# Patient Record
Sex: Male | Born: 1975 | Race: Black or African American | Hispanic: No | Marital: Single | Smoking: Current every day smoker
Health system: Southern US, Community
[De-identification: ages and names within clinical notes are randomized; demographics above are authoritative.]

## PROBLEM LIST (undated history)

## (undated) DIAGNOSIS — I10 Essential (primary) hypertension: Secondary | ICD-10-CM

## (undated) HISTORY — DX: Essential (primary) hypertension: I10

---

## 2004-08-08 HISTORY — PX: HERNIA REPAIR: SHX51

## 2018-09-26 ENCOUNTER — Other Ambulatory Visit: Payer: Self-pay

## 2018-09-26 ENCOUNTER — Encounter: Payer: Self-pay | Admitting: Emergency Medicine

## 2018-09-26 DIAGNOSIS — Z87891 Personal history of nicotine dependence: Secondary | ICD-10-CM | POA: Diagnosis not present

## 2018-09-26 DIAGNOSIS — K529 Noninfective gastroenteritis and colitis, unspecified: Secondary | ICD-10-CM | POA: Diagnosis not present

## 2018-09-26 DIAGNOSIS — R1033 Periumbilical pain: Secondary | ICD-10-CM | POA: Diagnosis present

## 2018-09-26 LAB — COMPREHENSIVE METABOLIC PANEL
ALT: 24 U/L (ref 0–44)
AST: 26 U/L (ref 15–41)
Albumin: 4.3 g/dL (ref 3.5–5.0)
Alkaline Phosphatase: 43 U/L (ref 38–126)
Anion gap: 7 (ref 5–15)
BUN: 16 mg/dL (ref 6–20)
CO2: 26 mmol/L (ref 22–32)
Calcium: 9.2 mg/dL (ref 8.9–10.3)
Chloride: 104 mmol/L (ref 98–111)
Creatinine, Ser: 0.94 mg/dL (ref 0.61–1.24)
Glucose, Bld: 112 mg/dL — ABNORMAL HIGH (ref 70–99)
Potassium: 3.8 mmol/L (ref 3.5–5.1)
Sodium: 137 mmol/L (ref 135–145)
Total Bilirubin: 0.6 mg/dL (ref 0.3–1.2)
Total Protein: 7.7 g/dL (ref 6.5–8.1)

## 2018-09-26 LAB — CBC WITH DIFFERENTIAL/PLATELET
Abs Immature Granulocytes: 0.03 10*3/uL (ref 0.00–0.07)
BASOS ABS: 0 10*3/uL (ref 0.0–0.1)
Basophils Relative: 0 %
Eosinophils Absolute: 0.1 10*3/uL (ref 0.0–0.5)
Eosinophils Relative: 1 %
HCT: 46.3 % (ref 39.0–52.0)
Hemoglobin: 15.2 g/dL (ref 13.0–17.0)
Immature Granulocytes: 0 %
Lymphocytes Relative: 28 %
Lymphs Abs: 2.1 10*3/uL (ref 0.7–4.0)
MCH: 28.3 pg (ref 26.0–34.0)
MCHC: 32.8 g/dL (ref 30.0–36.0)
MCV: 86.1 fL (ref 80.0–100.0)
Monocytes Absolute: 1.1 10*3/uL — ABNORMAL HIGH (ref 0.1–1.0)
Monocytes Relative: 14 %
Neutro Abs: 4.3 10*3/uL (ref 1.7–7.7)
Neutrophils Relative %: 57 %
PLATELETS: 243 10*3/uL (ref 150–400)
RBC: 5.38 MIL/uL (ref 4.22–5.81)
RDW: 12.8 % (ref 11.5–15.5)
WBC: 7.7 10*3/uL (ref 4.0–10.5)
nRBC: 0 % (ref 0.0–0.2)

## 2018-09-26 LAB — LIPASE, BLOOD: Lipase: 36 U/L (ref 11–51)

## 2018-09-26 LAB — TROPONIN I: Troponin I: <0.03 ng/mL (ref <0.03)

## 2018-09-26 MED ORDER — SODIUM CHLORIDE 0.9% FLUSH: 3.0000 mL | Freq: Once | INTRAVENOUS | Status: DC

## 2018-09-26 NOTE — ED Triage Notes (Signed)
Patient states that he is having abd pain around his navel and it is intermittent but is sharp. Patient has been having diarrhea about 5x.

## 2018-09-27 ENCOUNTER — Emergency Department: Payer: BLUE CROSS/BLUE SHIELD

## 2018-09-27 ENCOUNTER — Telehealth: Payer: Self-pay | Admitting: Gastroenterology

## 2018-09-27 ENCOUNTER — Emergency Department
Admission: EM | Admit: 2018-09-27 | Discharge: 2018-09-27 | Disposition: A | Discharge status: Home / Self Care | Payer: BLUE CROSS/BLUE SHIELD | Attending: Emergency Medicine | Admitting: Emergency Medicine

## 2018-09-27 DIAGNOSIS — R1033 Periumbilical pain: Secondary | ICD-10-CM

## 2018-09-27 DIAGNOSIS — R197 Diarrhea, unspecified: Secondary | ICD-10-CM

## 2018-09-27 DIAGNOSIS — K529 Noninfective gastroenteritis and colitis, unspecified: Secondary | ICD-10-CM

## 2018-09-27 LAB — URINALYSIS, COMPLETE (UACMP) WITH MICROSCOPIC
Bacteria, UA: NONE SEEN
Bilirubin Urine: NEGATIVE
Glucose, UA: NEGATIVE mg/dL
Hgb urine dipstick: NEGATIVE
Ketones, ur: NEGATIVE mg/dL
Leukocytes,Ua: NEGATIVE
Nitrite: NEGATIVE
Protein, ur: NEGATIVE mg/dL
SQUAMOUS EPITHELIAL / LPF: NONE SEEN (ref 0–5)
Specific Gravity, Urine: 1.028 (ref 1.005–1.030)
pH: 5 (ref 5.0–8.0)

## 2018-09-27 MED ORDER — AMOXICILLIN-POT CLAVULANATE 875-125 MG PO TABS
1.0000 | ORAL_TABLET | Freq: Once | ORAL | Status: AC
  Administered 2018-09-27: 1 via ORAL
  Filled 2018-09-27: qty 1

## 2018-09-27 MED ORDER — IOHEXOL 300 MG/ML  SOLN
125.0000 mL | Freq: Once | INTRAMUSCULAR | Status: AC | PRN
  Administered 2018-09-27: 125 mL via INTRAVENOUS

## 2018-09-27 MED ORDER — METRONIDAZOLE 500 MG PO TABS: 500.0000 mg | ORAL_TABLET | Freq: Three times a day (TID) | ORAL | 0 refills | Status: AC

## 2018-09-27 MED ORDER — DOCUSATE SODIUM 100 MG PO CAPS: ORAL_CAPSULE | ORAL | 0 refills | Status: DC

## 2018-09-27 MED ORDER — MORPHINE SULFATE (PF) 4 MG/ML IV SOLN
4.0000 mg | Freq: Once | INTRAVENOUS | Status: AC
  Administered 2018-09-27: 4 mg via INTRAVENOUS
  Filled 2018-09-27: qty 1

## 2018-09-27 MED ORDER — ONDANSETRON HCL 4 MG/2ML IJ SOLN
4.0000 mg | INTRAMUSCULAR | Status: AC
  Administered 2018-09-27: 4 mg via INTRAVENOUS
  Filled 2018-09-27: qty 2

## 2018-09-27 MED ORDER — METRONIDAZOLE 500 MG PO TABS
500.0000 mg | ORAL_TABLET | Freq: Once | ORAL | Status: AC
  Administered 2018-09-27: 500 mg via ORAL
  Filled 2018-09-27: qty 1

## 2018-09-27 MED ORDER — OXYCODONE-ACETAMINOPHEN 5-325 MG PO TABS: 2.0000 | ORAL_TABLET | Freq: Four times a day (QID) | ORAL | 0 refills | Status: DC | PRN

## 2018-09-27 MED ORDER — ONDANSETRON 4 MG PO TBDP: ORAL_TABLET | ORAL | 0 refills | Status: DC

## 2018-09-27 MED ORDER — AMOXICILLIN-POT CLAVULANATE 875-125 MG PO TABS: 1.0000 | ORAL_TABLET | Freq: Three times a day (TID) | ORAL | 0 refills | Status: AC

## 2018-09-27 NOTE — Telephone Encounter (Signed)
I received note in my in basket to schedule patient in the next wk or wk 1/2. The patient was offered 10-08-18 @ 1:00 but declined!! Stated he would call back to scheduled.

## 2018-09-27 NOTE — Discharge Instructions (Addendum)
As we discussed, I think that your symptoms are most likely due to an infection in your small intestine which is resulting in a condition called enteritis.  I have prescribed 2 antibiotics and encourage you to take the full course of treatment.  However, it is important that you follow-up with a GI doctor such as Dr. Tobi Bastos to discuss whether or not you need additional work-up to make sure you do not have a condition called Crohn's disease.  Please call the office of Dr. Tobi Bastos and schedule the next available follow-up appointment; let them know that the emergency department physician ask you to call and schedule the appointment for ED follow-up.  Please use over-the-counter ibuprofen and Tylenol as needed according to label instructions for pain.Take Percocet as prescribed for severe pain. Do not drink alcohol, drive or participate in any other potentially dangerous activities while taking this medication as it may make you sleepy. Do not take this medication with any other sedating medications, either prescription or over-the-counter. If you were prescribed Percocet or Vicodin, do not take these with acetaminophen (Tylenol) as it is already contained within these medications.   This medication is an opiate (or narcotic) pain medication and can be habit forming.  Use it as little as possible to achieve adequate pain control.  Do not use or use it with extreme caution if you have a history of opiate abuse or dependence.  If you are on a pain contract with your primary care doctor or a pain specialist, be sure to let them know you were prescribed this medication today from the [facility] Emergency Department.  This medication is intended for your use only - do not give any to anyone else and keep it in a secure place where nobody else, especially children, have access to it.  It will also cause or worsen constipation, so you may want to consider taking an over-the-counter stool softener while you are taking  this medication.  Return to the emergency department if you develop new or worsening symptoms that concern you.

## 2018-09-27 NOTE — ED Provider Notes (Signed)
[facility] Emergency Department Provider Note  ____________________________________________   First MD Initiated Contact with Patient 09/27/18 [phone]     (approximate)  I have reviewed the triage vital signs and the nursing notes.   HISTORY  Chief Complaint Abdominal Pain    HPI Bradley SchullerKevin Davidson is a 43 y.o. male with history of prior abdominal hernia repair about 15 years ago who presents by private vehicle for evaluation of gradually worsening periumbilical abdominal pain over the course of the day today.  He has had no nausea nor vomiting but has had at least 5 episodes of diarrhea.  He says that he feels bloated and swollen up and his pain is severe.  The pain is dull and aching and nothing in particular makes it better and it is worse with moving around.  He has never had anything like this before.  He denies fever/chills, chest pain, shortness of breath, nausea, vomiting, and dysuria.  The pain is nonradiating and is generally in the center of his abdomen located right around his umbilicus.  History reviewed. No pertinent past medical history.  There are no active problems to display for this patient.   Past Surgical History:  Procedure Laterality Date  . HERNIA REPAIR  2006    Prior to Admission medications   Medication Sig Start Date End Date Taking? Authorizing Provider  amoxicillin-clavulanate (AUGMENTIN) 875-125 MG tablet Take 1 tablet by mouth every 8 (eight) hours for 10 days. 09/27/18 10/07/18  Loleta RoseForbach, Nimah Uphoff, MD  docusate sodium (COLACE) 100 MG capsule Take 1 tablet once or twice daily as needed for constipation while taking narcotic pain medicine 09/27/18   Loleta RoseForbach, Hillard Goodwine, MD  metroNIDAZOLE (FLAGYL) 500 MG tablet Take 1 tablet (500 mg total) by mouth 3 (three) times daily for 10 days. 09/27/18 10/07/18  Loleta RoseForbach, Abriana Saltos, MD  ondansetron (ZOFRAN ODT) 4 MG disintegrating tablet Allow 1-2 tablets to dissolve in your mouth every 8 hours as needed for  nausea/vomiting 09/27/18   Loleta RoseForbach, Syrus Nakama, MD  oxyCODONE-acetaminophen (PERCOCET) 5-325 MG tablet Take 2 tablets by mouth every 6 (six) hours as needed for severe pain. 09/27/18   Loleta RoseForbach, Cynara Tatham, MD    Allergies Patient has no allergy information on record.  No family history on file.  Social History Social History   Tobacco Use  . Smoking status: Former Smoker  Substance Use Topics  . Alcohol use: Not on file    Comment: occassionally   . Drug use: Never    Review of Systems Constitutional: No fever/chills Eyes: No visual changes. ENT: No sore throat. Cardiovascular: Denies chest pain. Respiratory: Denies shortness of breath. Gastrointestinal: Periumbilical abdominal pain with diarrhea but no nausea or vomiting, as described above Genitourinary: Negative for dysuria. Musculoskeletal: Negative for neck pain.  Negative for back pain. Integumentary: Negative for rash. Neurological: Negative for headaches, focal weakness or numbness.   ____________________________________________   PHYSICAL EXAM:  VITAL SIGNS: ED Triage Vitals  Enc Vitals Group     BP 09/26/18 2317 (!) 168/112     Pulse Rate 09/26/18 2317 92     Resp 09/26/18 2317 18     Temp 09/26/18 2317 98.8 F (37.1 C)     Temp Source 09/26/18 2317 Oral     SpO2 09/26/18 2317 95 %     Weight 09/26/18 2313 136.1 kg (300 lb)     Height 09/26/18 2313 1.905 m (6\' 3" )     Head Circumference --      Peak Flow --  Pain Score 09/26/18 2313 9     Pain Loc --      Pain Edu? --      Excl. in GC? --     Constitutional: Alert and oriented. Well appearing and in no acute distress. Eyes: Conjunctivae are normal.  Head: Atraumatic. Nose: No congestion/rhinnorhea. Mouth/Throat: Mucous membranes are moist. Neck: No stridor.  No meningeal signs.   Cardiovascular: Normal rate, regular rhythm. Good peripheral circulation. Grossly normal heart sounds. Respiratory: Normal respiratory effort.  No retractions. Lungs  CTAB. Gastrointestinal: Abdomen appears distended and is somewhat tense.  He has an old, well-healed surgical scar just above his umbilicus.  When he sits up he has a bulging ventral hernia but it is easily reduced and goes back by itself when he is not straining.  He has tenderness to palpation throughout the abdomen, but it seems to be worse around the umbilicus. Musculoskeletal: No lower extremity tenderness nor edema. No gross deformities of extremities. Neurologic:  Normal speech and language. No gross focal neurologic deficits are appreciated.  Skin:  Skin is warm, dry and intact. No rash noted. Psychiatric: Mood and affect are normal. Speech and behavior are normal.  ____________________________________________   LABS (all labs ordered are listed, but only abnormal results are displayed)  Labs Reviewed  COMPREHENSIVE METABOLIC PANEL - Abnormal; Notable for the following components:      Result Value   Glucose, Bld 112 (*)    All other components within normal limits  URINALYSIS, COMPLETE (UACMP) WITH MICROSCOPIC - Abnormal; Notable for the following components:   Color, Urine YELLOW (*)    APPearance CLEAR (*)    All other components within normal limits  CBC WITH DIFFERENTIAL/PLATELET - Abnormal; Notable for the following components:   Monocytes Absolute 1.1 (*)    All other components within normal limits  LIPASE, BLOOD  TROPONIN I   ____________________________________________  EKG  ED ECG REPORT I, Loleta Rose, the attending physician, personally viewed and interpreted this ECG.  Date: 09/26/2018 EKG Time: 23:15 Rate: 93 Rhythm: normal sinus rhythm QRS Axis: normal Intervals: normal ST/T Wave abnormalities: Non-specific ST segment / T-wave changes, but no clear evidence of acute ischemia. Narrative Interpretation: no definitive evidence of acute ischemia; does not meet STEMI criteria.   ____________________________________________  RADIOLOGY   ED MD  interpretation: Diffuse enteritis, no evidence of obstruction or ileus.  Possibly infectious in origin or Crohn's disease/IBD.  Official radiology report(s): Ct Abdomen Pelvis W Contrast  Result Date: 09/27/2018 CLINICAL DATA:  43 year old male with abdominal pain, nausea vomiting, diarrhea. EXAM: CT ABDOMEN AND PELVIS WITH CONTRAST TECHNIQUE: Multidetector CT imaging of the abdomen and pelvis was performed using the standard protocol following bolus administration of intravenous contrast. CONTRAST:  OMNIPAQUE IOHEXOL 300 MG/ML  SOLN COMPARISON:  None. FINDINGS: Lower chest: Mild elevation of the right hemidiaphragm. Minimal right lung base atelectasis. No pericardial or pleural effusion. Hepatobiliary: Negative liver and gallbladder. Pancreas: Negative. Spleen: Negative. Adrenals/Urinary Tract: Normal adrenal glands. Bilateral renal enhancement and contrast excretion is symmetric and normal. Normal ureters. Diminutive and unremarkable urinary bladder. Stomach/Bowel: Negative rectosigmoid colon. Negative descending colon aside from mild retained stool. Negative transverse and ascending colon. Normal appendix (coronal image 48). The terminal ileum appears relatively normal, but there is abnormal small bowel wall thickening affecting multiple loops as demonstrated on series 2, images 66 through 73. The loops are nondilated. The ileum is affected. Much of the jejunum loops appear normal. No free fluid. Negative stomach and duodenum. No free  air. There are 2 small ventral fat containing hernias demonstrated on sagittal image 70. Vascular/Lymphatic: The major arterial structures are patent and appear normal. Portal venous system is patent. No lymphadenopathy. Reproductive: Negative. Other: No pelvic free fluid. Musculoskeletal: No acute osseous abnormality identified. IMPRESSION: 1. Multiple thick walled and inflamed loops of mid and distal small bowel. Main differential considerations are infectious enteritis  and Crohn disease. 2. No bowel obstruction, free fluid, or other complicating features. 3. Two small fat containing ventral abdominal hernias. Electronically Signed   By: Odessa Fleming M.D.   On: 09/27/2018 03:21    ____________________________________________   PROCEDURES  Critical Care performed: No   Procedure(s) performed:   Procedures   ____________________________________________   INITIAL IMPRESSION / ASSESSMENT AND PLAN / ED COURSE  As part of my medical decision making, I reviewed the following data within the electronic MEDICAL RECORD NUMBER Nursing notes reviewed and incorporated, Labs reviewed , EKG interpreted , Old chart reviewed and Notes from prior ED visits    Differential diagnosis includes, but is not limited to, SBO or partial SBO, ileus, abdominal/ventral hernia incarceration or strangulation, viral infection, appendicitis, gallbladder disease, diverticulitis.  Based on the patient's history and symptoms I believe he most likely has at least a partial SBO if not a full SBO but the fact that he is not having any nausea or vomiting suggest to me this may be a partial process.  Ileus is certainly also possible.  His lab work is all reassuring and he has no leukocytosis and I doubt that this is an infectious process.  He is not tolerating oral intake and I think that a CT scan with IV contrast will be sufficient to see what we need to see.  His vital signs are stable and he has no signs of ischemia on his EKG.  He is very uncomfortable and I am giving him morphine 4 mg IV and Zofran 4 mg IV.  He is to remain n.p.o.  He and his wife understand and agree with the plan.  Clinical Course as of Sep 27 452  Thu Sep 27, 2018  1610 The patient feels much better at this time and is no longer in any distress.  His blood pressure remains elevated but I think that is multifactorial and not anything that is resulting in his symptoms at this time.  His lab work remains reassuring but his CT  scan has enteritis throughout, radiologist suspects infectious versus IBD.  I discussed this with the patient and encouraged him to follow-up closely as an outpatient with GI.  I also sent a message to Dr. Tobi Bastos through [facility] to help facilitate outpatient follow-up.  I am prescribing Augmentin and Flagyl for the enteritis and gave strict return precautions.  He and his wife understand and agree with the plan.   [CF]    Clinical Course User Index [CF] Loleta Rose, MD    ____________________________________________  FINAL CLINICAL IMPRESSION(S) / ED DIAGNOSES  Final diagnoses:  Enteritis  Periumbilical abdominal pain  Diarrhea, unspecified type     MEDICATIONS GIVEN DURING THIS VISIT:  Medications  sodium chloride flush (NS) 0.9 % injection 3 mL (0 mLs Intravenous Hold 09/27/18 0300)  morphine 4 MG/ML injection 4 mg (4 mg Intravenous Given 09/27/18 0318)  ondansetron (ZOFRAN) injection 4 mg (4 mg Intravenous Given 09/27/18 0317)  iohexol (OMNIPAQUE) 300 MG/ML solution 125 mL (125 mLs Intravenous Contrast Given 09/27/18 0302)  amoxicillin-clavulanate (AUGMENTIN) 875-125 MG per tablet 1 tablet (1 tablet Oral  Given 09/27/18 0451)  metroNIDAZOLE (FLAGYL) tablet 500 mg (500 mg Oral Given 09/27/18 0451)     ED Discharge Orders         Ordered    amoxicillin-clavulanate (AUGMENTIN) 875-125 MG tablet  Every 8 hours     09/27/18 0444    metroNIDAZOLE (FLAGYL) 500 MG tablet  3 times daily     09/27/18 0444    oxyCODONE-acetaminophen (PERCOCET) 5-325 MG tablet  Every 6 hours PRN     09/27/18 0444    ondansetron (ZOFRAN ODT) 4 MG disintegrating tablet     09/27/18 0444    docusate sodium (COLACE) 100 MG capsule     09/27/18 0444           Note:  This document was prepared using Dragon voice recognition software and may include unintentional dictation errors.   Loleta Rose, MD 09/27/18 [phone]

## 2018-09-28 NOTE — Telephone Encounter (Signed)
I called patient again to schedule appt. The patient declined stating he was given antibiotics & if this did not help he would call to schedule. Dr Loleta Rose sent Dr Tobi Bastos an email to schedule an appointment and it was forwarded to me. Reason for the appointment abd pain with multiple episodes of diarrhea.

## 2019-10-26 IMAGING — CT CT ABD-PELV W/ CM
2 of 5 series · 16 of 46 positions shown, 18 images · IV contrast (APPLIED)
Comparison: None.

CLINICAL DATA: 42-year-old male with abdominal pain, nausea
vomiting, diarrhea.

EXAM:
CT ABDOMEN AND PELVIS WITH CONTRAST
TECHNIQUE: Multidetector CT imaging of the abdomen and pelvis was performed
using the standard protocol following bolus administration of
intravenous contrast.
CONTRAST:  125mL OMNIPAQUE IOHEXOL 300 MG/ML  SOLN

[Series 2: routine abd/pel with · axial · 0.94mm/px · z∈[-1011,-501]mm · 13 of 116 slices shown, 15 images]
[im 7/116  soft-tissue]
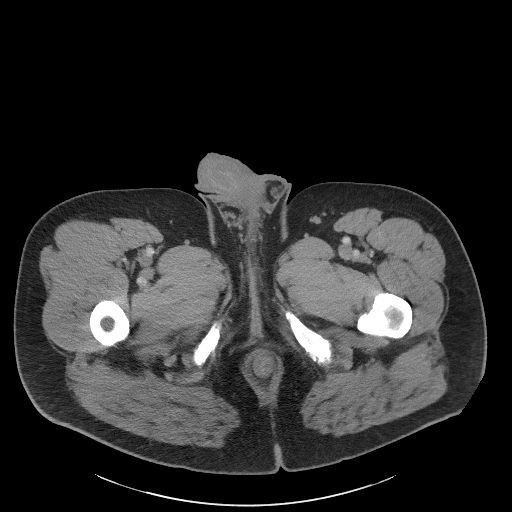
[im 7/116  bone]
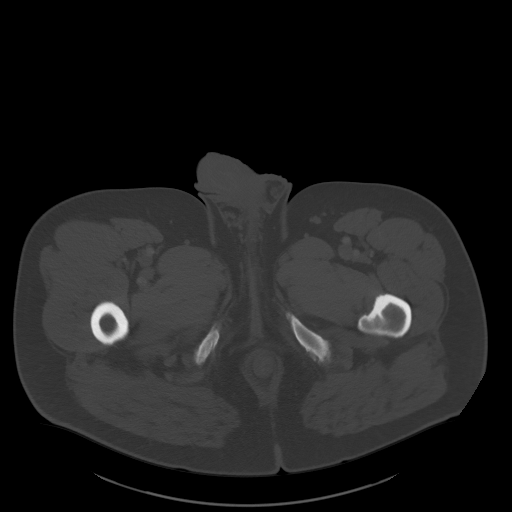
[im 13/116  soft-tissue]
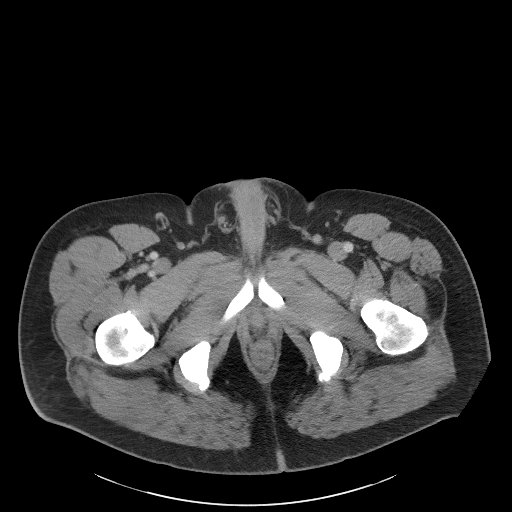
[im 26/116  soft-tissue]
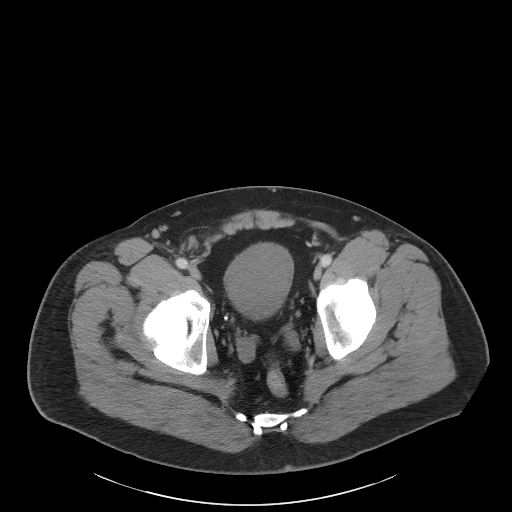
[im 32/116  soft-tissue]
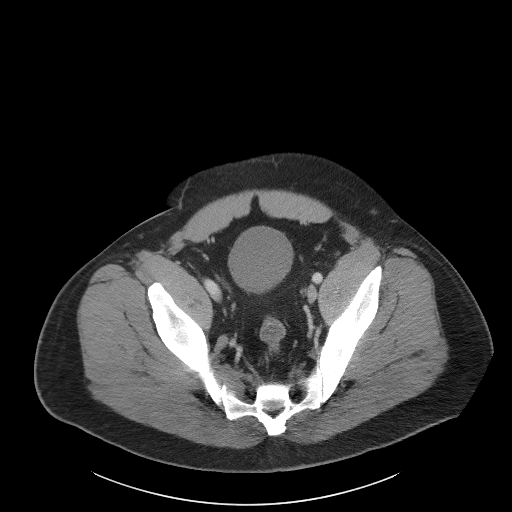
[im 39/116  soft-tissue]
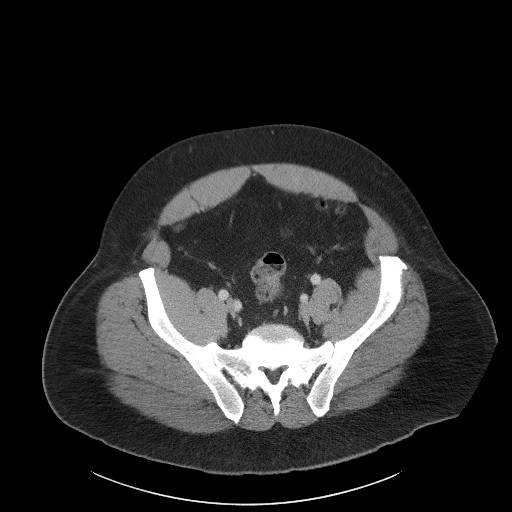
[im 52/116  soft-tissue]
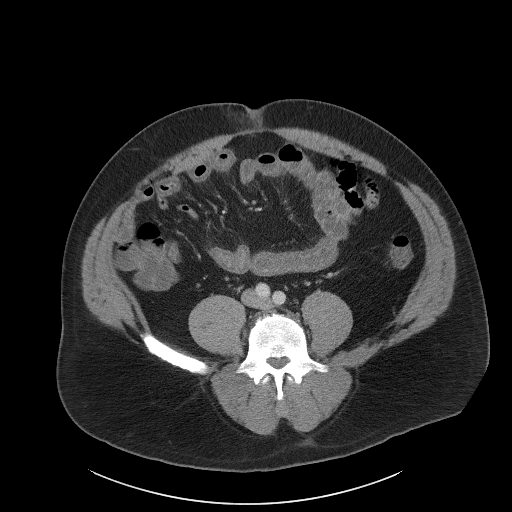
[im 58/116  soft-tissue]
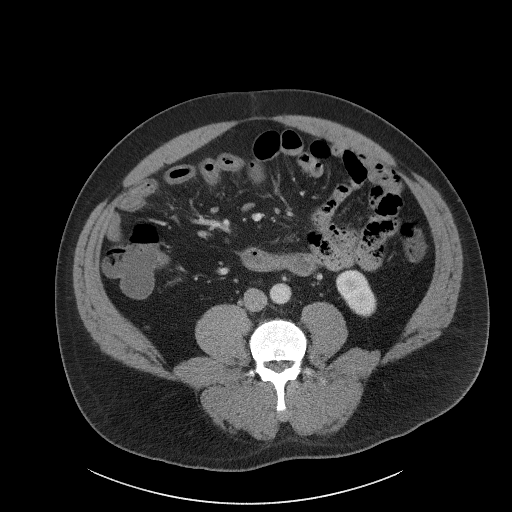
[im 64/116  soft-tissue]
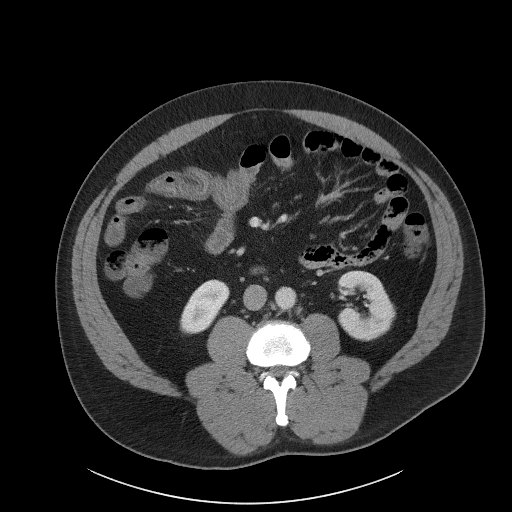
[im 77/116  soft-tissue]
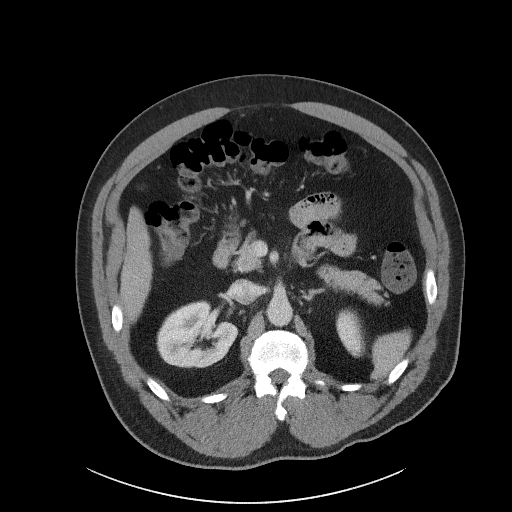
[im 77/116  bone]
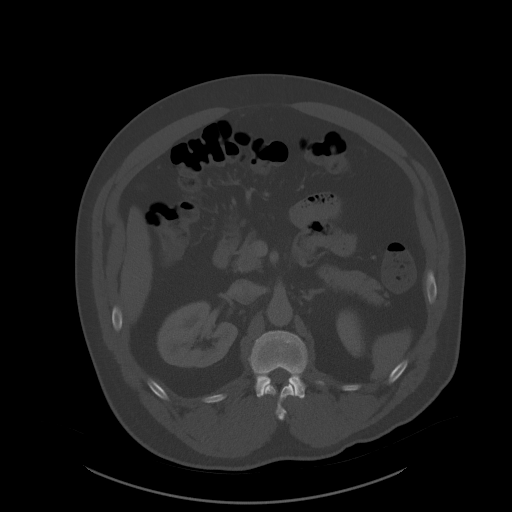
[im 84/116  soft-tissue]
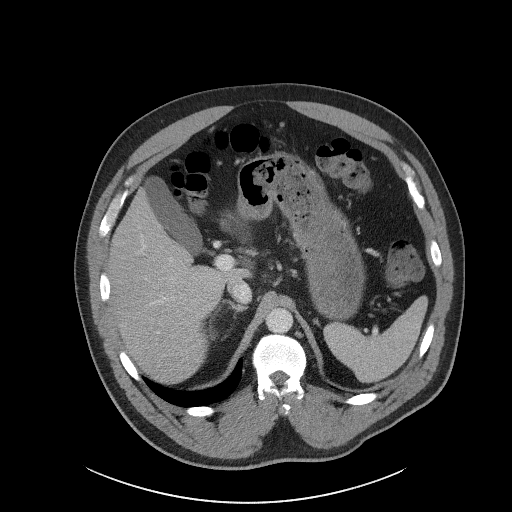
[im 90/116  soft-tissue]
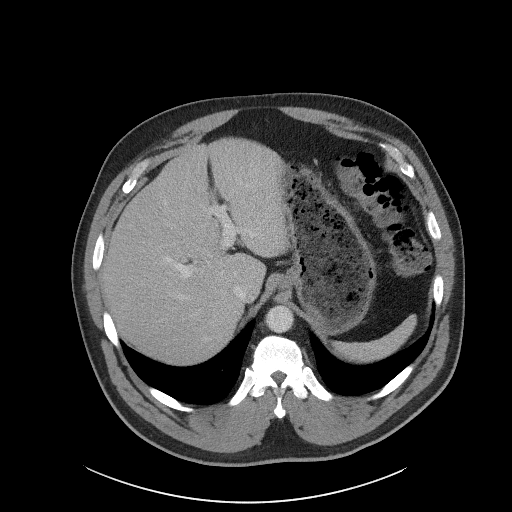
[im 103/116  soft-tissue]
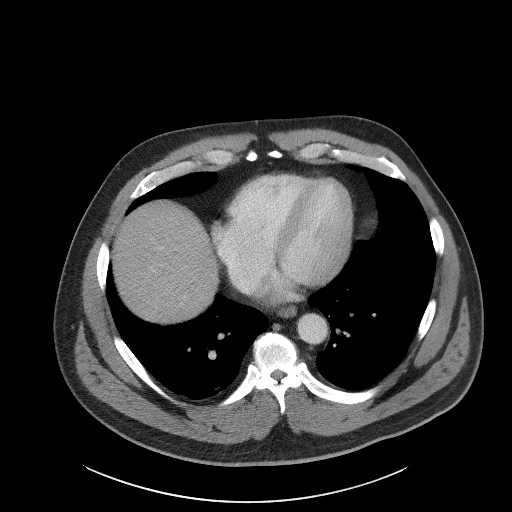
[im 109/116  soft-tissue]
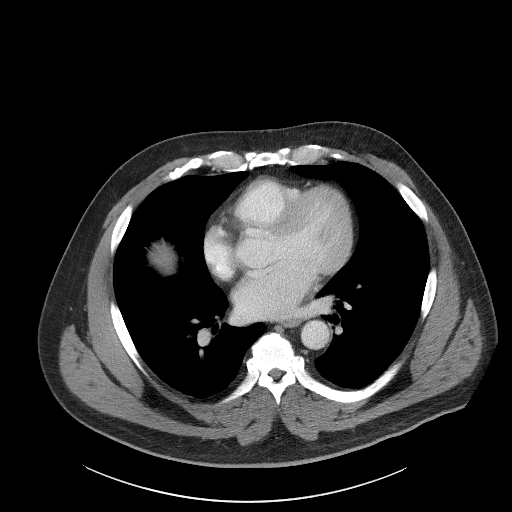

[Series 5: coronal st · coronal · 0.93mm/px · 3 of 114 slices shown]
[im 38/114  soft-tissue]
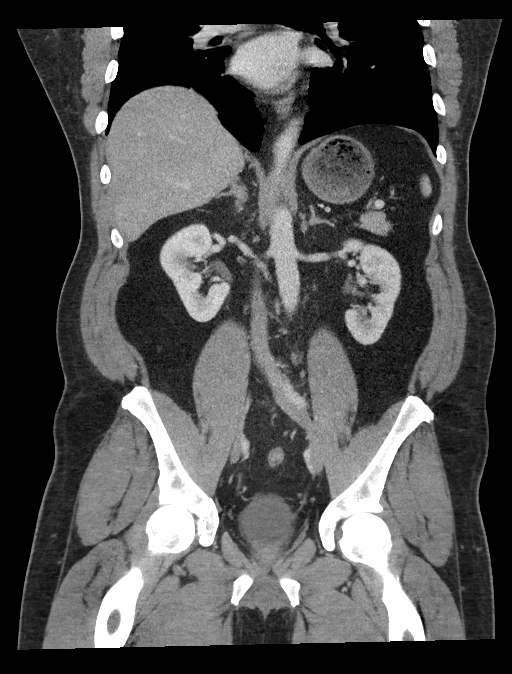
[im 51/114  soft-tissue]
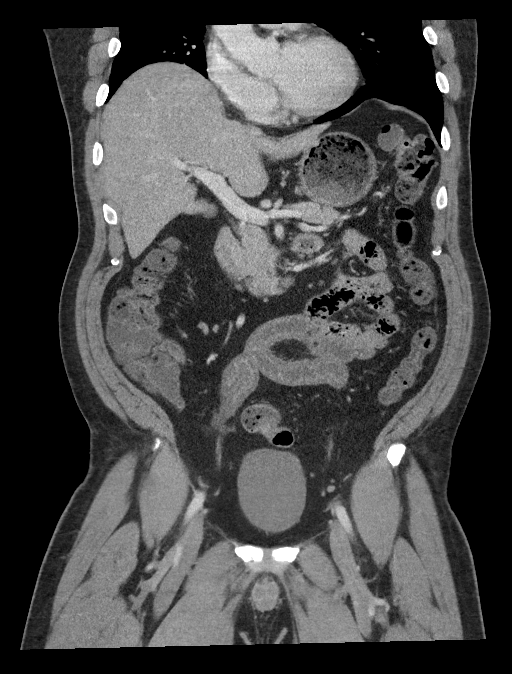
[im 63/114  soft-tissue]
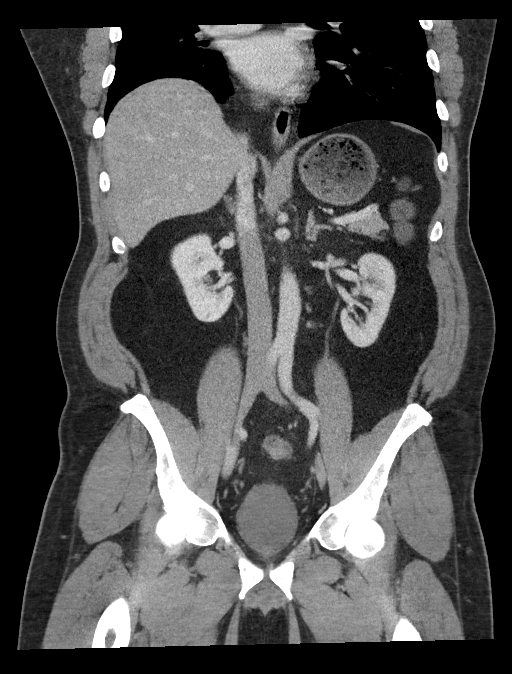

[16 of 46 positions shown; findings below may reference images not displayed]

FINDINGS: Lower chest: Mild elevation of the right hemidiaphragm. Minimal
right lung base atelectasis. No pericardial or pleural effusion.

Hepatobiliary: Negative liver and gallbladder.

Pancreas: Negative.

Spleen: Negative.

Adrenals/Urinary Tract: Normal adrenal glands.

Bilateral renal enhancement and contrast excretion is symmetric and
normal. Normal ureters. Diminutive and unremarkable urinary bladder.

Stomach/Bowel: Negative rectosigmoid colon. Negative descending
colon aside from mild retained stool. Negative transverse and
ascending colon. Normal appendix (coronal image 48).

The terminal ileum appears relatively normal, but there is abnormal
small bowel wall thickening affecting multiple loops as demonstrated
on series 2, images 66 through 73. The loops are nondilated. The
ileum is affected. Much of the jejunum loops appear normal. No free
fluid. Negative stomach and duodenum. No free air.

There are 2 small ventral fat containing hernias demonstrated on
sagittal image 70.

Vascular/Lymphatic: The major arterial structures are patent and
appear normal. Portal venous system is patent.

No lymphadenopathy.

Reproductive: Negative.

Other: No pelvic free fluid.

Musculoskeletal: No acute osseous abnormality identified.
IMPRESSION: 1. Multiple thick walled and inflamed loops of mid and distal small
bowel. Main differential considerations are infectious enteritis and
Crohn disease.
2. No bowel obstruction, free fluid, or other complicating features.
3. Two small fat containing ventral abdominal hernias.

## 2022-06-11 ENCOUNTER — Emergency Department (HOSPITAL_BASED_OUTPATIENT_CLINIC_OR_DEPARTMENT_OTHER)
Admission: EM | Admit: 2022-06-11 | Discharge: 2022-06-11 | Disposition: A | Discharge status: Home / Self Care | Payer: BC Managed Care – PPO | Attending: Emergency Medicine | Admitting: Emergency Medicine

## 2022-06-11 ENCOUNTER — Other Ambulatory Visit: Payer: Self-pay

## 2022-06-11 ENCOUNTER — Encounter (HOSPITAL_BASED_OUTPATIENT_CLINIC_OR_DEPARTMENT_OTHER): Payer: Self-pay | Admitting: Emergency Medicine

## 2022-06-11 DIAGNOSIS — R1012 Left upper quadrant pain: Secondary | ICD-10-CM | POA: Diagnosis present

## 2022-06-11 DIAGNOSIS — I10 Essential (primary) hypertension: Secondary | ICD-10-CM | POA: Diagnosis not present

## 2022-06-11 LAB — CBC WITH DIFFERENTIAL/PLATELET
Abs Immature Granulocytes: 0.02 10*3/uL (ref 0.00–0.07)
Basophils Absolute: 0 10*3/uL (ref 0.0–0.1)
Basophils Relative: 0 %
Eosinophils Absolute: 0.3 10*3/uL (ref 0.0–0.5)
Eosinophils Relative: 4 %
HCT: 45.7 % (ref 39.0–52.0)
Hemoglobin: 15.3 g/dL (ref 13.0–17.0)
Immature Granulocytes: 0 %
Lymphocytes Relative: 44 %
Lymphs Abs: 3.1 10*3/uL (ref 0.7–4.0)
MCH: 29.8 pg (ref 26.0–34.0)
MCHC: 33.5 g/dL (ref 30.0–36.0)
MCV: 88.9 fL (ref 80.0–100.0)
Monocytes Absolute: 0.7 10*3/uL (ref 0.1–1.0)
Monocytes Relative: 10 %
Neutro Abs: 2.9 10*3/uL (ref 1.7–7.7)
Neutrophils Relative %: 42 %
Platelets: 218 10*3/uL (ref 150–400)
RBC: 5.14 MIL/uL (ref 4.22–5.81)
RDW: 12.8 % (ref 11.5–15.5)
WBC: 7 10*3/uL (ref 4.0–10.5)
nRBC: 0 % (ref 0.0–0.2)

## 2022-06-11 LAB — URINALYSIS, MICROSCOPIC (REFLEX)

## 2022-06-11 LAB — URINALYSIS, ROUTINE W REFLEX MICROSCOPIC
Bilirubin Urine: NEGATIVE
Glucose, UA: NEGATIVE mg/dL
Ketones, ur: NEGATIVE mg/dL
Leukocytes,Ua: NEGATIVE
Nitrite: NEGATIVE
Protein, ur: NEGATIVE mg/dL
Specific Gravity, Urine: 1.02 (ref 1.005–1.030)
pH: 7 (ref 5.0–8.0)

## 2022-06-11 LAB — COMPREHENSIVE METABOLIC PANEL
ALT: 16 U/L (ref 0–44)
AST: 18 U/L (ref 15–41)
Albumin: 3.5 g/dL (ref 3.5–5.0)
Alkaline Phosphatase: 43 U/L (ref 38–126)
Anion gap: 4 — ABNORMAL LOW (ref 5–15)
BUN: 11 mg/dL (ref 6–20)
CO2: 29 mmol/L (ref 22–32)
Calcium: 8.1 mg/dL — ABNORMAL LOW (ref 8.9–10.3)
Chloride: 104 mmol/L (ref 98–111)
Creatinine, Ser: 0.97 mg/dL (ref 0.61–1.24)
GFR, Estimated: >60 mL/min (ref >60)
Glucose, Bld: 114 mg/dL — ABNORMAL HIGH (ref 70–99)
Potassium: 3.3 mmol/L — ABNORMAL LOW (ref 3.5–5.1)
Sodium: 137 mmol/L (ref 135–145)
Total Bilirubin: 0.2 mg/dL — ABNORMAL LOW (ref 0.3–1.2)
Total Protein: 6.6 g/dL (ref 6.5–8.1)

## 2022-06-11 LAB — LIPASE, BLOOD: Lipase: 52 U/L — ABNORMAL HIGH (ref 11–51)

## 2022-06-11 MED ORDER — ALUM & MAG HYDROXIDE-SIMETH 200-200-20 MG/5ML PO SUSP
30.0000 mL | Freq: Once | ORAL | Status: AC
  Administered 2022-06-11: 30 mL via ORAL
  Filled 2022-06-11: qty 30

## 2022-06-11 MED ORDER — PANTOPRAZOLE SODIUM 40 MG PO TBEC: 40.0000 mg | DELAYED_RELEASE_TABLET | Freq: Every day | ORAL | 0 refills | Status: AC

## 2022-06-11 NOTE — ED Notes (Signed)
Pt taking home BP medication that he brought with him per Karle Starch, MD.

## 2022-06-11 NOTE — ED Provider Notes (Signed)
[facility] EMERGENCY DEPARTMENT  Provider Note  CSN: [id number] Arrival date & time: 06/11/22 0539  History Chief Complaint  Patient presents with   Flank Pain    Bradley Davidson is a 46 y.o. male with history of HTN reports several days of intermittent cramping LUQ pain, radiates into his upper back, not associated with N/V/D or dysuria. No fever, cough, CP or SOB. Has tried icy/hot without improvement. Denies any EtOH or NSAID use.    Home Medications Prior to Admission medications   Medication Sig Start Date End Date Taking? Authorizing Provider  pantoprazole (PROTONIX) 40 MG tablet Take 1 tablet (40 mg total) by mouth daily. 06/11/22  Yes Pollyann Savoy, MD     Allergies    Patient has no known allergies.   Review of Systems   Review of Systems Please see HPI for pertinent positives and negatives  Physical Exam BP (!) 173/117   Pulse 79   Temp 97.6 F (36.4 C) (Oral)   Resp (!) 25   Ht 6' 3.5" (1.918 m)   Wt 131.5 kg   SpO2 96%   BMI 35.77 kg/m   Physical Exam Vitals and nursing note reviewed.  Constitutional:      Appearance: Normal appearance.  HENT:     Head: Normocephalic and atraumatic.     Nose: Nose normal.     Mouth/Throat:     Mouth: Mucous membranes are moist.  Eyes:     Extraocular Movements: Extraocular movements intact.     Conjunctiva/sclera: Conjunctivae normal.  Cardiovascular:     Rate and Rhythm: Normal rate.  Pulmonary:     Effort: Pulmonary effort is normal.     Breath sounds: Normal breath sounds.  Abdominal:     General: Abdomen is flat.     Palpations: Abdomen is soft. There is no mass.     Tenderness: There is abdominal tenderness (LUQ). There is no guarding.  Musculoskeletal:        General: No swelling or tenderness. Normal range of motion.     Cervical back: Neck supple.  Skin:    General: Skin is warm and dry.     Findings: No rash.  Neurological:     General: No focal deficit present.     Mental  Status: He is alert.  Psychiatric:        Mood and Affect: Mood normal.     ED Results / Procedures / Treatments   EKG EKG Interpretation  Date/Time:  Saturday June 11 2022 06:00:28 EDT Ventricular Rate:  81 PR Interval:  169 QRS Duration: 103 QT Interval:  380 QTC Calculation: 442 R Axis:   91 Text Interpretation: Sinus rhythm Atrial premature complex Borderline right axis deviation No significant change since last tracing Confirmed by Susy Frizzle [id number]) on 06/11/2022 6:02:07 AM  Procedures Procedures  Medications Ordered in the ED Medications  alum & mag hydroxide-simeth (MAALOX/MYLANTA) 200-200-20 MG/5ML suspension 30 mL (30 mLs Oral Given 06/11/22 0606)    Initial Impression and Plan  Patient with LUQ, no clear etiology by history or exam. Abdomen is benign. Will check labs and EKG. GI cocktail to see if that helps. BP is elevated but he has not taken his Losartan in a couple of days.   ED Course   Clinical Course as of 06/11/22 0655  Sat Jun 11, 2022  0614 CBC is normal.  [CS]  [phone] Patient will take his usual morning BP med dose.  [CS]  0634 CMP and  lipase unremarkable.  [CS]  [id number] Patient reports he is feeling better after GI cocktail. May be some gastritis, GERD or PUD as the cause. No signs of bleeding or perforation on exam. UA is pending, but if no signs of infection or significant hematuria, plan discharged with Rx for PPI and PCP follow up.  [CS]  [id number] UA is unremarkable. Plan discharge as above.  [CS]    Clinical Course User Index [CS] Truddie Hidden, MD     MDM Rules/Calculators/A&P Medical Decision Making Problems Addressed: LUQ pain: acute illness or injury Uncontrolled hypertension: chronic illness or injury  Amount and/or Complexity of Data Reviewed Labs: ordered. Decision-making details documented in ED Course. ECG/medicine tests: ordered and independent interpretation performed. Decision-making details documented in ED  Course.  Risk OTC drugs. Prescription drug management.    Final Clinical Impression(s) / ED Diagnoses Final diagnoses:  LUQ pain  Uncontrolled hypertension    Rx / DC Orders ED Discharge Orders          Ordered    pantoprazole (PROTONIX) 40 MG tablet  Daily        06/11/22 0654             Truddie Hidden, MD 06/11/22 [phone]

## 2022-06-11 NOTE — ED Triage Notes (Signed)
Pt states he has been cramping on his left side from under his breast to around to his back x 1 week  Pt states he has been taking OTC medication without relief

## 2022-07-26 ENCOUNTER — Emergency Department (HOSPITAL_BASED_OUTPATIENT_CLINIC_OR_DEPARTMENT_OTHER)
Admission: EM | Admit: 2022-07-26 | Discharge: 2022-07-26 | Disposition: A | Discharge status: Home / Self Care | Payer: BC Managed Care – PPO | Attending: Emergency Medicine | Admitting: Emergency Medicine

## 2022-07-26 ENCOUNTER — Other Ambulatory Visit: Payer: Self-pay

## 2022-07-26 DIAGNOSIS — I10 Essential (primary) hypertension: Secondary | ICD-10-CM | POA: Insufficient documentation

## 2022-07-26 DIAGNOSIS — Z79899 Other long term (current) drug therapy: Secondary | ICD-10-CM | POA: Insufficient documentation

## 2022-07-26 NOTE — Discharge Instructions (Signed)
Continue the current medications that you are on.  Check your blood pressure daily and write down the results and take those readings and your blood pressure cuff with you for your next appointment next week for recheck of your blood pressure.  If you are feeling normal that is all you need to do.  If you start getting chest pain, shortness of breath, headache, numbness or tingling return to the emergency room or follow-up with your doctor sooner.

## 2022-07-26 NOTE — ED Triage Notes (Signed)
Patient presents to ED via POV from home. Here with hypertension. History of same. On medication for same. Denies any symptoms. Reports he is unable to complete a physical for work because his blood pressure is too high.

## 2022-07-26 NOTE — ED Provider Notes (Signed)
[place] EMERGENCY DEPARTMENT Provider Note   CSN: [id number] Arrival date & time: 07/26/22  1219     History  Chief Complaint  Patient presents with   Hypertension    Bradley Davidson is a 46 y.o. male.  Patient is a 46 year old male with a history of hypertension who is presenting today due to elevated blood pressure.  Patient reports he is felt fine but he went to the doctor today to get a physical because he is planning on changing jobs.  He has been on 100 mg of losartan for the last 2 to 3 months per his doctor Dr. Ronnald Ramp.  He has not been regularly checking his blood pressure because he has been feeling normal.  When he checked it this morning it was 135/90 but when he got to the doctor it was more elevated than this.  This started wearing him and the more he checked it it remained elevated at 150/106.  He has not had any chest pain, shortness of breath, headache, unilateral numbness or weakness.  He was placed on clonidine yesterday and his just tarted taking this medication.  Here patient's blood pressures are ranging between 130-140/90-100.  He states he does not eat a high sodium diet but does not watch the sodium closely.  He has had issues with reactions to other blood pressure medications in the past with lip swelling and lower extremity swelling.  The history is provided by the patient.  Hypertension       Home Medications Prior to Admission medications   Medication Sig Start Date End Date Taking? Authorizing Provider  pantoprazole (PROTONIX) 40 MG tablet Take 1 tablet (40 mg total) by mouth daily. 06/11/22   Truddie Hidden, MD      Allergies    Patient has no known allergies.    Review of Systems   Review of Systems  Physical Exam Updated Vital Signs BP (!) 143/103 (BP Location: Right Arm)   Pulse (!) 58   Temp 97.7 F (36.5 C) (Oral)   Resp 18   SpO2 100%  Physical Exam Vitals and nursing note reviewed.  Constitutional:      General: He is  not in acute distress.    Appearance: He is well-developed.  HENT:     Head: Normocephalic and atraumatic.  Eyes:     Conjunctiva/sclera: Conjunctivae normal.     Pupils: Pupils are equal, round, and reactive to light.  Cardiovascular:     Rate and Rhythm: Normal rate and regular rhythm.     Heart sounds: No murmur heard. Pulmonary:     Effort: Pulmonary effort is normal. No respiratory distress.     Breath sounds: Normal breath sounds. No wheezing or rales.  Abdominal:     General: There is no distension.     Palpations: Abdomen is soft.     Tenderness: There is no abdominal tenderness. There is no guarding or rebound.  Musculoskeletal:        General: No tenderness. Normal range of motion.     Cervical back: Normal range of motion and neck supple.  Skin:    General: Skin is warm and dry.     Findings: No erythema or rash.  Neurological:     Mental Status: He is alert and oriented to person, place, and time.  Psychiatric:        Behavior: Behavior normal.     ED Results / Procedures / Treatments   Labs (all labs ordered are listed,  but only abnormal results are displayed) Labs Reviewed - No data to display  EKG None  Radiology No results found.  Procedures Procedures    Medications Ordered in ED Medications - No data to display  ED Course/ Medical Decision Making/ A&P                           Medical Decision Making  Patient presenting today due to hypertension.  He is asymptomatic with this.  He has been on 100 mg losartan for the last 2 to 3 months but then was started on 0.1 mg of clonidine yesterday.  Patient's blood pressure is still elevated here however he reports having recent labs done which were normal and do not feel that he needs additional testing here.  Encouraged him to continue on this new medication regime.  That he will need to be on this for at least 1 to 2 weeks to see if he needs any more adjustments.  Encouraged him to take his blood  pressure daily and write down the results and follow-up next week as planned.  No indication to suggest endorgan damage today.        Final Clinical Impression(s) / ED Diagnoses Final diagnoses:  Uncontrolled hypertension    Rx / DC Orders ED Discharge Orders     None         Gwyneth Sprout, MD 07/26/22 1450

## 2023-07-07 ENCOUNTER — Emergency Department (HOSPITAL_BASED_OUTPATIENT_CLINIC_OR_DEPARTMENT_OTHER)
Admission: EM | Admit: 2023-07-07 | Discharge: 2023-07-08 | Disposition: A | Discharge status: Home / Self Care | Payer: Self-pay | Attending: Emergency Medicine | Admitting: Emergency Medicine

## 2023-07-07 ENCOUNTER — Encounter (HOSPITAL_BASED_OUTPATIENT_CLINIC_OR_DEPARTMENT_OTHER): Payer: Self-pay | Admitting: Urology

## 2023-07-07 DIAGNOSIS — R059 Cough, unspecified: Secondary | ICD-10-CM | POA: Insufficient documentation

## 2023-07-07 DIAGNOSIS — M546 Pain in thoracic spine: Secondary | ICD-10-CM | POA: Insufficient documentation

## 2023-07-07 DIAGNOSIS — K469 Unspecified abdominal hernia without obstruction or gangrene: Secondary | ICD-10-CM | POA: Insufficient documentation

## 2023-07-07 DIAGNOSIS — I10 Essential (primary) hypertension: Secondary | ICD-10-CM | POA: Insufficient documentation

## 2023-07-07 LAB — I-STAT CHEM 8, ED
BUN: 9 mg/dL (ref 6–20)
Calcium, Ion: 1.14 mmol/L — ABNORMAL LOW (ref 1.15–1.40)
Chloride: 100 mmol/L (ref 98–111)
Creatinine, Ser: 1.1 mg/dL (ref 0.61–1.24)
Glucose, Bld: 90 mg/dL (ref 70–99)
HCT: 47 % (ref 39.0–52.0)
Hemoglobin: 16 g/dL (ref 13.0–17.0)
Potassium: 3.3 mmol/L — ABNORMAL LOW (ref 3.5–5.1)
Sodium: 139 mmol/L (ref 135–145)
TCO2: 27 mmol/L (ref 22–32)

## 2023-07-07 LAB — CBC WITH DIFFERENTIAL/PLATELET
Abs Immature Granulocytes: 0.01 10*3/uL (ref 0.00–0.07)
Basophils Absolute: 0 10*3/uL (ref 0.0–0.1)
Basophils Relative: 0 %
Eosinophils Absolute: 0.1 10*3/uL (ref 0.0–0.5)
Eosinophils Relative: 2 %
HCT: 44.6 % (ref 39.0–52.0)
Hemoglobin: 15 g/dL (ref 13.0–17.0)
Immature Granulocytes: 0 %
Lymphocytes Relative: 47 %
Lymphs Abs: 2.9 10*3/uL (ref 0.7–4.0)
MCH: 29.3 pg (ref 26.0–34.0)
MCHC: 33.6 g/dL (ref 30.0–36.0)
MCV: 87.1 fL (ref 80.0–100.0)
Monocytes Absolute: 0.5 10*3/uL (ref 0.1–1.0)
Monocytes Relative: 8 %
Neutro Abs: 2.7 10*3/uL (ref 1.7–7.7)
Neutrophils Relative %: 43 %
Platelets: 281 10*3/uL (ref 150–400)
RBC: 5.12 MIL/uL (ref 4.22–5.81)
RDW: 12.7 % (ref 11.5–15.5)
WBC: 6.2 10*3/uL (ref 4.0–10.5)
nRBC: 0 % (ref 0.0–0.2)

## 2023-07-07 MED ORDER — FENTANYL CITRATE PF 50 MCG/ML IJ SOSY
50.0000 ug | PREFILLED_SYRINGE | Freq: Once | INTRAMUSCULAR | Status: AC
  Administered 2023-07-07: 50 ug via INTRAVENOUS
  Filled 2023-07-07: qty 1

## 2023-07-07 NOTE — ED Provider Notes (Signed)
   [place] [facility]  Provider Note  CSN: [id number] Arrival date & time: 07/07/23 2246  History Chief Complaint  Patient presents with   Back Pain    Bradley Davidson is a 47 y.o. male with history of HTN, (reports compliance with medication) reports about 2 weeks ago after coughing he felt a pain in his thoracic back, between his shoulder blades, that has been getting progressively worse since then. Not improved with Motrin, icy-hot, etc. He denies any chest pains, numbness, tingling, abdominal pain, neck pain.    Home Medications Prior to Admission medications   Medication Sig Start Date End Date Taking? Authorizing Provider  pantoprazole (PROTONIX) 40 MG tablet Take 1 tablet (40 mg total) by mouth daily. 06/11/22   Pollyann Savoy, MD     Allergies    Patient has no known allergies.   Review of Systems   Review of Systems Please see HPI for pertinent positives and negatives  Physical Exam BP (!) 176/116 (BP Location: Right Arm)   Pulse 81   Temp 98.4 F (36.9 C) (Oral)   Resp 18   Ht 6\' 3"  (1.905 m)   Wt 131.5 kg   SpO2 97%   BMI 36.24 kg/m   Physical Exam Vitals and nursing note reviewed.  Constitutional:      Appearance: Normal appearance.  HENT:     Head: Normocephalic and atraumatic.     Nose: Nose normal.     Mouth/Throat:     Mouth: Mucous membranes are moist.  Eyes:     Extraocular Movements: Extraocular movements intact.     Conjunctiva/sclera: Conjunctivae normal.  Cardiovascular:     Rate and Rhythm: Normal rate.     Pulses: Normal pulses.  Pulmonary:     Effort: Pulmonary effort is normal.     Breath sounds: Normal breath sounds.  Abdominal:     General: Abdomen is flat.     Palpations: Abdomen is soft.     Tenderness: There is no abdominal tenderness.  Musculoskeletal:        General: Tenderness (thoracic back, midline and paraspinal muscles) present. No swelling. Normal range of motion.      Cervical back: Neck supple.  Skin:    General: Skin is warm and dry.  Neurological:     General: No focal deficit present.     Mental Status: He is alert.     Cranial Nerves: No cranial nerve deficit.     Sensory: No sensory deficit.     Motor: No weakness.     Gait: Gait normal.  Psychiatric:        Mood and Affect: Mood normal.     ED Results / Procedures / Treatments   EKG None  Procedures Procedures  Medications Ordered in the ED Medications  fentaNYL (SUBLIMAZE) injection 50 mcg (has no administration in time range)    Initial Impression and Plan  Patient here with thoracic back pain, likely MSK, but given his elevated BP, concern for dissection. Will check labs, send for CTA. Pain medication for comfort.   ED Course       MDM Rules/Calculators/A&P Medical Decision Making Amount and/or Complexity of Data Reviewed Labs: ordered. Radiology: ordered.  Risk Prescription drug management.     Final Clinical Impression(s) / ED Diagnoses Final diagnoses:  None    Rx / DC Orders ED Discharge Orders     None

## 2023-07-07 NOTE — ED Triage Notes (Signed)
Pt states mid thoracic back pain that started 1 week ago after coughing hard and feeling a strain  States hard to get comfortable  Pain worse with laying down, feels better to stand

## 2023-07-08 ENCOUNTER — Emergency Department (HOSPITAL_BASED_OUTPATIENT_CLINIC_OR_DEPARTMENT_OTHER): Payer: Self-pay

## 2023-07-08 ENCOUNTER — Encounter (HOSPITAL_BASED_OUTPATIENT_CLINIC_OR_DEPARTMENT_OTHER): Payer: Self-pay

## 2023-07-08 MED ORDER — METHOCARBAMOL 500 MG PO TABS: 500.0000 mg | ORAL_TABLET | Freq: Two times a day (BID) | ORAL | 0 refills | Status: AC

## 2023-07-08 MED ORDER — KETOROLAC TROMETHAMINE 30 MG/ML IJ SOLN
30.0000 mg | Freq: Once | INTRAMUSCULAR | Status: AC
  Administered 2023-07-08: 30 mg via INTRAVENOUS
  Filled 2023-07-08: qty 1

## 2023-07-08 MED ORDER — NAPROXEN 500 MG PO TABS: 500.0000 mg | ORAL_TABLET | Freq: Two times a day (BID) | ORAL | 0 refills | Status: AC

## 2023-07-08 MED ORDER — IOHEXOL 350 MG/ML SOLN
100.0000 mL | Freq: Once | INTRAVENOUS | Status: AC | PRN
  Administered 2023-07-08: 100 mL via INTRAVENOUS

## 2023-07-08 NOTE — ED Notes (Signed)
